# Patient Record
Sex: Male | Born: 1995 | Race: Black or African American | Hispanic: No | Marital: Single | Smoking: Former smoker
Health system: Southern US, Community
[De-identification: ages and names within clinical notes are randomized; demographics above are authoritative.]

## PROBLEM LIST (undated history)

## (undated) DIAGNOSIS — R0789 Other chest pain: Secondary | ICD-10-CM

## (undated) DIAGNOSIS — F1210 Cannabis abuse, uncomplicated: Secondary | ICD-10-CM

## (undated) DIAGNOSIS — R0602 Shortness of breath: Secondary | ICD-10-CM

## (undated) DIAGNOSIS — R9431 Abnormal electrocardiogram [ECG] [EKG]: Secondary | ICD-10-CM

## (undated) DIAGNOSIS — R059 Cough, unspecified: Secondary | ICD-10-CM

## (undated) DIAGNOSIS — R05 Cough: Secondary | ICD-10-CM

## (undated) HISTORY — DX: Shortness of breath: R06.02

## (undated) HISTORY — DX: Other chest pain: R07.89

## (undated) HISTORY — DX: Cough, unspecified: R05.9

## (undated) HISTORY — DX: Cannabis abuse, uncomplicated: F12.10

## (undated) HISTORY — DX: Cough: R05

## (undated) HISTORY — DX: Abnormal electrocardiogram (ECG) (EKG): R94.31

---
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
## 2017-08-29 ENCOUNTER — Other Ambulatory Visit: Payer: Self-pay

## 2017-08-29 ENCOUNTER — Emergency Department (HOSPITAL_COMMUNITY)
Admission: EM | Admit: 2017-08-29 | Discharge: 2017-08-29 | Disposition: A | Discharge status: Home / Self Care | Payer: Self-pay | Attending: Emergency Medicine | Admitting: Emergency Medicine

## 2017-08-29 ENCOUNTER — Encounter (HOSPITAL_COMMUNITY): Payer: Self-pay | Admitting: Emergency Medicine

## 2017-08-29 DIAGNOSIS — B356 Tinea cruris: Secondary | ICD-10-CM | POA: Insufficient documentation

## 2017-08-29 MED ORDER — CLOTRIMAZOLE 1 % EX CREA: TOPICAL_CREAM | CUTANEOUS | 0 refills | Status: DC

## 2017-08-29 NOTE — ED Provider Notes (Signed)
[name] EMERGENCY DEPARTMENT Provider Note   CSN: [id number] Arrival date & time: 08/29/17  0849     History   Chief Complaint Chief Complaint  Patient presents with  . Rash  . Pruritis    HPI Phillip Vasquez is a 22 y.o. male.  HPI  Phillip Vasquez is a 22yo male with no significant past medical history who presents to the emergency department for evaluation of itchy bumps in the groin area.  Patient reports that Phillip Vasquez noticed this rash about 2 days ago.  The rash is bilateral and bumpy in the groin area.  It does not extend to be penis or testicles.  States that Phillip Vasquez has been applying hydrating lotion to the area which helps the itching somewhat.  Phillip Vasquez denies pain over the rash.  Denies any new lotions, soaps, detergents.  Denies fevers, chills, penile discharge, testicular swelling/pain.  Reports that Phillip Vasquez is sexually active with one male partner whom Phillip Vasquez had unprotected sex with about a month ago.  History reviewed. No pertinent past medical history.  There are no active problems to display for this patient.   History reviewed. No pertinent surgical history.      Home Medications    Prior to Admission medications   Not on File    Family History No family history on file.  Social History Social History   Tobacco Use  . Smoking status: Never Smoker  . Smokeless tobacco: Never Used  Substance Use Topics  . Alcohol use: Yes  . Drug use: Not Currently     Allergies   Patient has no allergy information on record.   Review of Systems Review of Systems  Constitutional: Negative for chills and fever.  Gastrointestinal: Negative for abdominal pain, nausea and vomiting.  Genitourinary: Negative for difficulty urinating, discharge, flank pain, frequency, genital sores, penile pain and scrotal swelling.  Musculoskeletal: Negative for gait problem.  Skin: Positive for rash (bilateral bumpy rash in groin area). Negative for color change and wound.    Neurological: Negative for headaches.    Physical Exam Updated Vital Signs BP 138/88 (BP Location: Right Arm)   Pulse 71   Temp 98.9 F (37.2 C) (Oral)   Resp 16   Ht 5\' 7"  (1.702 m)   Wt 122.5 kg (270 lb)   SpO2 100%   BMI 42.29 kg/m   Physical Exam  Constitutional: Phillip Vasquez appears well-developed and well-nourished. No distress.  HENT:  Head: Normocephalic and atraumatic.  Eyes: Right eye exhibits no discharge. Left eye exhibits no discharge.  Pulmonary/Chest: Effort normal. No respiratory distress.  Genitourinary:  Genitourinary Comments: Chaperone present for exam. Bilateral papular rash over the inguinal area with some raised plaques with central clearing. Does not spread to the testicles or penis. No overlying erythema or warmth. No discharge from penis. No signs of lesion or erythema on the penis or testicles. The penis and testicles are nontender. No testicular masses or swelling. No signs of any inguinal hernias.  Neurological: Phillip Vasquez is alert. Coordination normal.  Skin: Skin is warm and dry. Phillip Vasquez is not diaphoretic.  Psychiatric: Phillip Vasquez has a normal mood and affect. His behavior is normal.  Nursing note and vitals reviewed.    ED Treatments / Results  Labs (all labs ordered are listed, but only abnormal results are displayed) Labs Reviewed - No data to display  EKG None  Radiology No results found.  Procedures Procedures (including critical care time)  Medications Ordered in ED Medications - No data  to display   Initial Impression / Assessment and Plan / ED Course  I have reviewed the triage vital signs and the nursing notes.  Pertinent labs & imaging results that were available during my care of the patient were reviewed by me and considered in my medical decision making (see chart for details).    Rash consistent with tinea cruris. No grouped vesicles to suggest HSV. No blisters, no pustules, no warmth, no draining sinus tracts, no superficial abscesses, no  bullous impetigo, no vesicles, no desquamation, no target lesions with dusky purpura or a central bulla. Not tender to touch. No concern for superimposed infection. No concern for SJS, TEN, TSS, tick borne illness, syphilis or other life-threatening condition. Will discharge home with antifungal cream and recommend Benadryl as needed for pruritis. Discussed return precautions and patient agrees and voices understanding to plan. Counseled him that Phillip Vasquez should be regularly tested for STDs if having unprotected intercourse and have given him information to follow-up with the health department.  Patient agrees to plan and has no complaints prior to discharge.  Final Clinical Impressions(s) / ED Diagnoses   Final diagnoses:  Tinea cruris    ED Discharge Orders        Ordered    clotrimazole (LOTRIMIN) 1 % cream     08/29/17 1317       Kellie ShropshireShrosbree, Emily J, PA-C 08/29/17 1323    Arby BarrettePfeiffer, Marcy, MD 09/02/17 [phone]

## 2017-08-29 NOTE — Discharge Instructions (Addendum)
Apply cream to the rash twice a day.   You can take benadryl as needed for itching.   Return to the ER if you have fever, testicular swelling or pain, trouble urinating or have any new or concerning symptoms.

## 2017-08-29 NOTE — ED Triage Notes (Signed)
Pt. Stated, I have these bumps on my genitals and they itch a lot for 2 days . I want to make sure I  don't have herpes. A few weeks ago I had unprotected sex.

## 2018-09-23 ENCOUNTER — Other Ambulatory Visit: Payer: Self-pay

## 2018-09-23 ENCOUNTER — Emergency Department (HOSPITAL_COMMUNITY): Payer: Self-pay

## 2018-09-23 ENCOUNTER — Encounter (HOSPITAL_COMMUNITY): Payer: Self-pay

## 2018-09-23 ENCOUNTER — Emergency Department (HOSPITAL_COMMUNITY)
Admission: EM | Admit: 2018-09-23 | Discharge: 2018-09-23 | Disposition: A | Discharge status: Home / Self Care | Payer: Self-pay | Attending: Emergency Medicine | Admitting: Emergency Medicine

## 2018-09-23 DIAGNOSIS — R0602 Shortness of breath: Secondary | ICD-10-CM | POA: Insufficient documentation

## 2018-09-23 DIAGNOSIS — R9431 Abnormal electrocardiogram [ECG] [EKG]: Secondary | ICD-10-CM | POA: Insufficient documentation

## 2018-09-23 DIAGNOSIS — R059 Cough, unspecified: Secondary | ICD-10-CM

## 2018-09-23 DIAGNOSIS — F1290 Cannabis use, unspecified, uncomplicated: Secondary | ICD-10-CM | POA: Insufficient documentation

## 2018-09-23 DIAGNOSIS — R112 Nausea with vomiting, unspecified: Secondary | ICD-10-CM | POA: Insufficient documentation

## 2018-09-23 DIAGNOSIS — R05 Cough: Secondary | ICD-10-CM | POA: Insufficient documentation

## 2018-09-23 LAB — CBC WITH DIFFERENTIAL/PLATELET
Abs Immature Granulocytes: 0.02 10*3/uL (ref 0.00–0.07)
Basophils Absolute: 0 10*3/uL (ref 0.0–0.1)
Basophils Relative: 0 %
Eosinophils Absolute: 0.3 10*3/uL (ref 0.0–0.5)
Eosinophils Relative: 3 %
HCT: 50.7 % (ref 39.0–52.0)
Hemoglobin: 15.1 g/dL (ref 13.0–17.0)
Immature Granulocytes: 0 %
Lymphocytes Relative: 18 %
Lymphs Abs: 1.7 10*3/uL (ref 0.7–4.0)
MCH: 23.7 pg — ABNORMAL LOW (ref 26.0–34.0)
MCHC: 29.8 g/dL — ABNORMAL LOW (ref 30.0–36.0)
MCV: 79.7 fL — ABNORMAL LOW (ref 80.0–100.0)
Monocytes Absolute: 0.5 10*3/uL (ref 0.1–1.0)
Monocytes Relative: 6 %
Neutro Abs: 7 10*3/uL (ref 1.7–7.7)
Neutrophils Relative %: 73 %
Platelets: 341 10*3/uL (ref 150–400)
RBC: 6.36 MIL/uL — ABNORMAL HIGH (ref 4.22–5.81)
RDW: 14.4 % (ref 11.5–15.5)
WBC: 9.6 10*3/uL (ref 4.0–10.5)
nRBC: 0 % (ref 0.0–0.2)

## 2018-09-23 LAB — BASIC METABOLIC PANEL
Anion gap: 10 (ref 5–15)
BUN: <5 mg/dL — ABNORMAL LOW (ref 6–20)
CO2: 26 mmol/L (ref 22–32)
Calcium: 9.1 mg/dL (ref 8.9–10.3)
Chloride: 103 mmol/L (ref 98–111)
Creatinine, Ser: 0.95 mg/dL (ref 0.61–1.24)
GFR calc Af Amer: >60 mL/min (ref >60)
GFR calc non Af Amer: >60 mL/min (ref >60)
Glucose, Bld: 95 mg/dL (ref 70–99)
Potassium: 3.9 mmol/L (ref 3.5–5.1)
Sodium: 139 mmol/L (ref 135–145)

## 2018-09-23 LAB — TROPONIN I: Troponin I: <0.03 ng/mL (ref <0.03)

## 2018-09-23 MED ORDER — ALBUTEROL SULFATE HFA 108 (90 BASE) MCG/ACT IN AERS
2.0000 | INHALATION_SPRAY | Freq: Once | RESPIRATORY_TRACT | Status: AC
  Administered 2018-09-23: 18:00:00 2 via RESPIRATORY_TRACT
  Filled 2018-09-23: qty 6.7

## 2018-09-23 MED ORDER — BENZONATATE 100 MG PO CAPS: 100.0000 mg | ORAL_CAPSULE | Freq: Three times a day (TID) | ORAL | 0 refills | Status: AC | PRN

## 2018-09-23 NOTE — ED Provider Notes (Signed)
[name] EMERGENCY DEPARTMENT Provider Note   CSN: [id number] Arrival date & time: 09/23/18  1408    History   Chief Complaint Chief Complaint  Patient presents with  . Cough    HPI Braden L Hamblin is a 23 y.o. male with no significant past medical history presents today for evaluation of acute onset, persistent chest tightness beginning today.  He denies any chest pain but he feels somewhat short of breath constantly, no aggravating or alleviating factors noted.  He notes a nonproductive cough that may have been present for the last few days and he reports is mild.  He denies any fevers, chills, sore throat, nasal congestion, or abdominal pain.  He did have one episode of nonbloody nonbilious emesis yesterday but thinks it may have been posttussive in nature.  He smokes marijuana daily but denies any other drug use, alcohol intake, or cigarette smoking.  He has had no sick contacts but does work in a [employer] and at a [employer] as a Conservation officer, nature.  No family history of heart disease at a young age.  No recent travel or surgeries.     The history is provided by the patient.    History reviewed. No pertinent past medical history.  There are no active problems to display for this patient.   History reviewed. No pertinent surgical history.      Home Medications    Prior to Admission medications   Medication Sig Start Date End Date Taking? Authorizing Provider  benzonatate (TESSALON) 100 MG capsule Take 1 capsule (100 mg total) by mouth 3 (three) times daily as needed for cough. 09/23/18   Luevenia Maxin, Antavia Tandy A, PA-C  clotrimazole (LOTRIMIN) 1 % cream Apply to affected area 2 times daily 08/29/17   Kellie Shropshire, PA-C    Family History History reviewed. No pertinent family history.  Social History Social History   Tobacco Use  . Smoking status: Never Smoker  . Smokeless tobacco: Never Used  Substance Use Topics  . Alcohol use: Yes  . Drug use: Not  Currently     Allergies   Patient has no known allergies.   Review of Systems Review of Systems  Constitutional: Negative for chills and fever.  Respiratory: Positive for cough and shortness of breath.   Cardiovascular: Negative for chest pain.  Gastrointestinal: Positive for nausea (resolved) and vomiting (resolved). Negative for abdominal pain.  All other systems reviewed and are negative.    Physical Exam Updated Vital Signs BP 125/82   Pulse 73   Temp 98.1 F (36.7 C) (Oral)   Resp 19   SpO2 98%   Physical Exam Vitals signs and nursing note reviewed.  Constitutional:      General: He is not in acute distress.    Appearance: He is well-developed.  HENT:     Head: Normocephalic and atraumatic.  Eyes:     General:        Right eye: No discharge.        Left eye: No discharge.     Conjunctiva/sclera: Conjunctivae normal.  Neck:     Vascular: No JVD.     Trachea: No tracheal deviation.  Cardiovascular:     Rate and Rhythm: Normal rate and regular rhythm.     Pulses: Normal pulses.     Heart sounds: Normal heart sounds.  Pulmonary:     Effort: Pulmonary effort is normal.     Comments: Somewhat diminished breath sounds globally, speaking in full sentences without  difficulty Abdominal:     General: Abdomen is flat.     Palpations: Abdomen is soft.     Tenderness: There is no abdominal tenderness. There is no guarding or rebound.  Skin:    General: Skin is warm and dry.     Findings: No erythema.  Neurological:     Mental Status: He is alert.  Psychiatric:        Behavior: Behavior normal.      ED Treatments / Results  Labs (all labs ordered are listed, but only abnormal results are displayed) Labs Reviewed  BASIC METABOLIC PANEL - Abnormal; Notable for the following components:      Result Value   BUN <5 (*)    All other components within normal limits  CBC WITH DIFFERENTIAL/PLATELET - Abnormal; Notable for the following components:   RBC 6.36 (*)     MCV 79.7 (*)    MCH 23.7 (*)    MCHC 29.8 (*)    All other components within normal limits  TROPONIN I    EKG EKG Interpretation  Date/Time:  Friday September 23 2018 17:17:47 EDT Ventricular Rate:  68 PR Interval:    QRS Duration: 98 QT Interval:  378 QTC Calculation: 402 R Axis:   44 Text Interpretation:  Sinus rhythm Lateral infarct, acute Borderline ST elevation, anterior leads Marked T-wave abnormality, consider inferolateral ischemia No old tracing to compare Confirmed by Eber HongMiller, Brian ([id number]) on 09/23/2018 5:24:46 PM   Radiology Dg Chest Portable 1 View  Result Date: 09/23/2018 CLINICAL DATA:  Shortness of breath EXAM: PORTABLE CHEST 1 VIEW COMPARISON:  None. FINDINGS: The heart size and mediastinal contours are within normal limits. Both lungs are clear. The visualized skeletal structures are unremarkable. IMPRESSION: No active disease. Electronically Signed   By: Elige KoHetal  Patel   On: 09/23/2018 17:55    Procedures Procedures (including critical care time)  Medications Ordered in ED Medications  albuterol (VENTOLIN HFA) 108 (90 Base) MCG/ACT inhaler 2 puff (2 puffs Inhalation Given 09/23/18 1742)     Initial Impression / Assessment and Plan / ED Course  I have reviewed the triage vital signs and the nursing notes.  Pertinent labs & imaging results that were available during my care of the patient were reviewed by me and considered in my medical decision making (see chart for details).        Patient presenting for evaluation of acute onset chest tightness earlier today.  He is afebrile, initially mildly tachycardic and mildly tachypneic on triage but normal vital signs on my evaluation and subsequent reevaluation's.  He is nontoxic in appearance.  He did have some nausea and vomiting yesterday but none today and abdominal examination is benign.  Doubt acute surgical abdominal pathology.  Lung sounds are generally clear but somewhat diminished bilaterally, no wheezing or  other adventitious breath sounds noted.  He is low risk for heart disease and I doubt PE.  EKG showed some subtle potential ST segment elevations in leads I and aVL with what appeared to be depressions in leads III and aVF.  Will consult cardiology for further recommendations. 5:37 PM Spoke with Dr. Jacques NavyAcharya with cardiology who has reviewed the patient's EKG and we have discussed his presentation.  She feels that his EKG likely represents early repolarization changes vs LVH; she recommends repeat EKG in half an hour. Single trop reasonable.  Also recommends cardiology visit (televisit okay), and outpatient echo within the next 7-10 days.   Chest x-ray shows no acute cardiopulmonary  abnormalities.  Troponin negative, remainder of lab work reviewed by me shows no leukocytosis, no anemia, no metabolic arrangements, no renal insufficiency.  He is resting comfortably in no apparent distress on reevaluation.  He was ambulated in the ED with stable SPO2 saturations.  No evidence of cardiac tamponade, pericarditis, myocarditis, dissection, pneumonia, or pneumothorax.  Low suspicion of coronavirus however I did recommend that given he is experiencing a dry cough that he quarantine himself until he is asymptomatic.  We discussed hygiene and strict ED return precautions.  Will discharge with Tessalon for cough and albuterol inhaler as needed for shortness of breath.  Pt verbalized understanding of and agreement with plan and is safe for discharge home at this time.  Patient was seen and evaluated by Dr. Hyacinth Meeker who agrees with assessment and plan at this time.  JANICE HUGHBANKS was evaluated in Emergency Department on 09/23/2018 for the symptoms described in the history of present illness. He was evaluated in the context of the global COVID-19 pandemic, which necessitated consideration that the patient might be at risk for infection with the SARS-CoV-2 virus that causes COVID-19. Institutional protocols and algorithms that  pertain to the evaluation of patients at risk for COVID-19 are in a state of rapid change based on information released by regulatory bodies including the CDC and federal and state organizations. These policies and algorithms were followed during the patient's care in the ED.   Final Clinical Impressions(s) / ED Diagnoses   Final diagnoses:  SOB (shortness of breath)  Cough  Abnormal EKG    ED Discharge Orders         Ordered    benzonatate (TESSALON) 100 MG capsule  3 times daily PRN     09/23/18 1902           Jeanie Sewer, PA-C 09/23/18 2015    Eber Hong, MD 09/26/18 [phone]

## 2018-09-23 NOTE — ED Notes (Signed)
Ambulated pt with pulse ox. Pt had steady gait. Pt denies feeling dizzy or weak. However pt did state he felt short of breath, like he couldn't get a good deep breath in and slight chest tightness similar to what he was feeling before. Pt HR went up to 113 and ox stayed before 97-100%. Pt brought back to bed, hooked up to monitor and RN Ryland notified.

## 2018-09-23 NOTE — ED Triage Notes (Signed)
Pt reports cough for several days and new chest tightness onset today. Pt denies hx of asthma. Denies sick contacts.

## 2018-09-23 NOTE — ED Notes (Signed)
Patient verbalizes understanding of discharge instructions. Opportunity for questioning and answers were provided. Armband removed by staff, pt discharged from ED.  

## 2018-09-23 NOTE — ED Provider Notes (Signed)
The patient is a 23 year old male, he has no significant chronic medical problems other than a history of marijuana abuse.  States that he has had coughing for several days, he woke up this morning with tightness in his chest and a persistent cough.  He felt like he was constipated after having an episode of vomiting yesterday so he took some laxatives and then had some loose stools.  At this time he still feels the tightness.  His exam is unremarkable with clear heart and lung sounds, no murmurs, no edema, no JVD, no rales or wheezing.  EKG is abnormal with ST elevations in leads I and aVL with reciprocal ST depression in lead III and a slight depression in lead aVF.  Limb leads are abnormal but precordial leads look pretty normal.  We will pursue with cardiology consultation at least for evaluation of the EKG, the patient is low risk for ischemia however with tightness in the chest and an abnormal EKG we will perform a troponin.  Patient is agreeable  Medical screening examination/treatment/procedure(s) were conducted as a shared visit with non-physician practitioner(s) and myself.  I personally evaluated the patient during the encounter.  Clinical Impression:    Final diagnoses:  SOB (shortness of breath)  Cough  Abnormal EKG     EKG Interpretation  Date/Time:  Friday September 23 2018 18:31:52 EDT Ventricular Rate:  78 PR Interval:    QRS Duration: 85 QT Interval:  359 QTC Calculation: 409 R Axis:   46 Text Interpretation:  Sinus rhythm St elevation anterior leads Inferior T wave abnormality suggesting ischemia No significant change since last tracing Confirmed by Rochele Raring [phone]) on 09/24/2018 10:39:27 AM         Eber Hong, MD 09/26/18 1223

## 2018-09-23 NOTE — Discharge Instructions (Addendum)
Use the albuterol inhaler 1 to 2 puffs every 4-6 hours as needed for shortness of breath.  You can take Tessalon as needed for cough.  You can also alternate ibuprofen or Tylenol as needed for fevers or aches or pains.  Drink plenty of fluids and get plenty of rest.  I would recommend not going to work for the next 4 days while you have symptoms.  If you develop a fever, I would recommend not going back to work until you have been fever free for 3 days without the use of ibuprofen or Tylenol.  Your EKG was abnormal in the emergency department today.  However there was no evidence of a heart attack.  Please call the cardiologist's office on Monday to set up a follow-up appointment and an outpatient echocardiogram (ultrasound of the heart) to be completed in the next 7 to 10 days.  Return to the emergency department immediately if any concerning signs or symptoms develop such as worsening shortness of breath, high fevers, persistent vomiting, passing out, or chest pains.

## 2018-09-26 ENCOUNTER — Telehealth: Payer: Self-pay | Admitting: Cardiovascular Disease

## 2018-09-26 NOTE — Telephone Encounter (Signed)
   Cardiac Questionnaire:    Since your last visit or hospitalization:    1. Have you been having new or worsening chest pain? NO   2. Have you been having new or worsening shortness of breath? SOB with activity 3. Have you been having new or worsening leg swelling, wt gain, or increase in abdominal girth (pants fitting more tightly)? NO   4. Have you had any passing out spells? NO    *A YES to any of these questions would result in the appointment being kept. *If all the answers to these questions are NO, we should indicate that given the current situation regarding the worldwide coronarvirus pandemic, at the recommendation of the CDC, we are looking to limit gatherings in our waiting area, and thus will reschedule their appointment beyond four weeks from today.   _____________   COVID-19 Pre-Screening Questions:  . Do you currently have a fever? NO . Have you recently travelled on a cruise, internationally, or to [place], [place], [place], [place], [place], or [place], [place] [employer]) ? NO . Have you been in contact with someone that is currently pending confirmation of Covid19 testing or has been confirmed to have the Covid19 virus? NO Are you currently experiencing fatigue or cough? Yes over a week ago     Dr. Eden Emms has agreed to see new patient on his DOD day. Did Screening for patient, and moved patient to Friday.

## 2018-09-26 NOTE — Progress Notes (Deleted)
Virtual Visit via Video Note   This visit type was conducted due to national recommendations for restrictions regarding the COVID-19 Pandemic (e.g. social distancing) in an effort to limit this patient's exposure and mitigate transmission in our community.  Due to his co-morbid illnesses, this patient is at least at moderate risk for complications without adequate follow up.  This format is felt to be most appropriate for this patient at this time.  All issues noted in this document were discussed and addressed.  A limited physical exam was performed with this format.  Please refer to the patient's chart for his consent to telehealth for [facility].   Evaluation Performed:  Follow-up visit  Date:  09/26/2018   ID:  Phillip Vasquez, DOB 10/02/1995, MRN [id number]  Patient Location: Home Provider Location: Office  PCP:  Patient, No Pcp Per  Cardiologist:  Johnsie Cancel Electrophysiologist:  None   Chief Complaint:  Cough and Chest Pain   History of Present Illness:    Phillip Vasquez is a 23 y.o. male referred by [facility] ER Dr Noemi Chapel for chest pain. Reviewed ER note from 09/23/18 History of marijuana use. Cough for several days Constipation and vomiting Took laxative with loose stools Pain is atypical *** In ER telemetry normal Noted HR 113 with ambulation with sense of pain in chest and dyspnea Sats normal CXR NAD normal Labs normal troponin negative   ECG with ? Voltage LVH T wave inversions 3,F precordial J point elevation  Given Tessalon capsule for cough   ***   The patient does not have symptoms concerning for COVID-19 infection (fever, chills, cough, or new shortness of breath).   PMH:  No significant No previous surgeries    No outpatient medications have been marked as taking for the 09/28/18 encounter (Appointment) with Josue Hector, MD.     Allergies:   Patient has no known allergies.   Social History   Tobacco Use  . Smoking status: Never Smoker  . Smokeless  tobacco: Never Used  Substance Use Topics  . Alcohol use: Yes  . Drug use: Not Currently     Family Hx: The patient's family history is not on file.  ROS:   Please see the history of present illness.     All other systems reviewed and are negative.   Prior CV studies:   The following studies were reviewed today:  ER notes 4/17 labs ECG and CXR   Labs/Other Tests and Data Reviewed:    EKG:   See HPI no old tracings   Recent Labs: 09/23/2018: BUN <5; Creatinine, Ser 0.95; Hemoglobin 15.1; Platelets 341; Potassium 3.9; Sodium 139   Recent Lipid Panel No results found for: CHOL, TRIG, HDL, CHOLHDL, LDLCALC, LDLDIRECT  Wt Readings from Last 3 Encounters:  08/29/17 122.5 kg     Objective:    Vital Signs:  There were no vitals taken for this visit.   No distress Skin warm and dry No tachypnea No JVP elevation  No edema  ASSESSMENT & PLAN:    Chest Pain : atypical Abnormal ECG may have pericarditis. Lab work neede to include ESR, C reactive Protein ANA and RF Empiric Rx with ASA , ibuprofen 400 tid and colchicine 0.6 mg bid for 6 weeks or until symptoms gone. Echo to r/o effusion , structural heart disease and HOCM  Cough:  CXR normal non toxic normal WBC observe  Abnormal ECG:  See above at some point if echo normal may need cardiac  CTA to r/o coronary anomaly   COVID-19 Education: The signs and symptoms of COVID-19 were discussed with the patient and how to seek care for testing (follow up with PCP or arrange E-visit).  The importance of social distancing was discussed today.  Time:   Today, I have spent 30 minutes with the patient with telehealth technology discussing the above problems.     Medication Adjustments/Labs and Tests Ordered: Current medicines are reviewed at length with the patient today.  Concerns regarding medicines are outlined above.   Tests Ordered:  Lab work: ESR, ANA, RF C reactive protein Echo:  Abnormal ECG r/o HOCM and Effusion ?  Pericarditis   Medication Changes:  ASE 325 mg Ibuprofen 400 tid Colchicine 0.76m bid   Disposition:  Follow up in 8 weeks if echo normal   Signed, PJenkins Rouge MD  09/26/2018 3:39 PM    [place]

## 2018-09-26 NOTE — Telephone Encounter (Signed)
New Message           Need records from [place], patient is seen [place] on Wednesday.

## 2018-09-28 ENCOUNTER — Ambulatory Visit: Payer: Medicaid Other | Admitting: Cardiovascular Disease

## 2018-09-29 NOTE — Progress Notes (Signed)
Virtual Visit via Video Note   This visit type was conducted due to national recommendations for restrictions regarding the COVID-19 Pandemic (e.g. social distancing) in an effort to limit this patient's exposure and mitigate transmission in our community.  Due to his co-morbid illnesses, this patient is at least at moderate risk for complications without adequate follow up.  This format is felt to be most appropriate for this patient at this time.  All issues noted in this document were discussed and addressed.  A limited physical exam was performed with this format.  Please refer to the patient's chart for his consent to telehealth for [facility].   Evaluation Performed:  Follow-up visit  Date:  09/30/2018   ID:  Phillip Vasquez, DOB 1996/04/05, MRN [id number]  Patient Location: Home Provider Location: Office  PCP:  Patient, No Pcp Per  Cardiologist:  Johnsie Cancel Electrophysiologist:  None   Chief Complaint:  Cough and Chest Pain   History of Present Illness:    Phillip Vasquez is a 23 y.o. male referred by [facility] ER Dr Noemi Chapel for chest pain. Reviewed ER note from 09/23/18 History of marijuana use. Cough for several days Constipation and vomiting Took laxative with loose stools Pain is atypical    In ER telemetry normal Noted HR 113 with ambulation with sense of pain in chest and dyspnea Sats normal CXR NAD normal Labs normal troponin negative   ECG with ? 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 No pleurisy   The patient does not have symptoms concerning for COVID-19 infection (fever, chills, cough, or new shortness of breath).   PMH:  No significant No previous surgeries    Current Meds  Medication Sig  . benzonatate (TESSALON) 100 MG capsule Take  1 capsule (100 mg total) by mouth 3 (three) times daily as needed for cough.     Allergies:   Patient has no known allergies.   Social History   Tobacco Use  . Smoking status: Former Smoker    Types: Cigars  . Smokeless tobacco: Never Used  . Tobacco comment: OCC  Substance Use Topics  . Alcohol use: Yes    Comment: OCC  . Drug use: Not Currently    Types: Marijuana    Comment: OCC     Family Hx: The patient's family history includes Asthma in his brother; Healthy in his brother, father, mother, and sister.  ROS:   Please see the history of present illness.     All other systems reviewed and are negative.   Prior CV studies:   The following studies were reviewed today:  ER notes 4/17 labs ECG and CXR   Labs/Other Tests and Data Reviewed:    EKG:   See HPI no old tracings   Recent Labs: 09/23/2018: BUN <5; Creatinine, Ser 0.95; Hemoglobin 15.1; Platelets 341; Potassium 3.9; Sodium 139   Recent Lipid Panel No results found for: CHOL, TRIG, HDL, CHOLHDL, LDLCALC, LDLDIRECT  Wt Readings from Last 3 Encounters:  09/30/18 119.5 kg  08/29/17 122.5 kg     Objective:    Vital Signs:  BP 132/82   Pulse 71   Ht _0  (1.702 m)   Wt 119.5 kg   SpO2 98%   BMI 41.25  kg/m    No distress Skin warm and dry No tachypnea No JVP elevation  No edema  ASSESSMENT & PLAN:    Chest Pain : atypical Abnormal ECG may have pericarditis. Lab work neede to include ESR, C reactive Protein ANA and RF Empiric Rx with ASA , ibuprofen 400 tid and colchicine 0.6 mg bid for 6 weeks or until symptoms gone. Echo to r/o effusion , structural heart disease and HOCM  Cough:  CXR normal non toxic normal WBC observe  Abnormal ECG:  See above at some point if echo normal may need cardiac CTA to r/o coronary anomaly   COVID-19 Education: The signs and symptoms of COVID-19 were discussed with the patient and how to seek care for testing (follow up with PCP or arrange E-visit).  The  importance of social distancing was discussed today.  Time:   Today, I have spent 30 minutes with the patient with telehealth technology discussing the above problems.     Medication Adjustments/Labs and Tests Ordered: Current medicines are reviewed at length with the patient today.  Concerns regarding medicines are outlined above.   Tests Ordered:  Lab work: ESR, ANA, RF C reactive protein Echo:  Abnormal ECG r/o HOCM and Effusion ? Pericarditis   Medication Changes:  ASE 325 mg Ibuprofen 400 tid Colchicine 0.74m bid   Disposition:  Follow up in 8 weeks if echo normal   Signed, PJenkins Rouge MD  09/30/2018 10:09 AM    [place]

## 2018-09-30 ENCOUNTER — Ambulatory Visit (INDEPENDENT_AMBULATORY_CARE_PROVIDER_SITE_OTHER): Payer: Self-pay | Admitting: Cardiovascular Disease

## 2018-09-30 ENCOUNTER — Encounter: Payer: Self-pay | Admitting: Cardiovascular Disease

## 2018-09-30 ENCOUNTER — Other Ambulatory Visit: Payer: Self-pay

## 2018-09-30 VITALS — BP 132/82 | HR 71 | Ht 67.0 in | Wt 263.4 lb

## 2018-09-30 DIAGNOSIS — R079 Chest pain, unspecified: Secondary | ICD-10-CM

## 2018-09-30 DIAGNOSIS — R0789 Other chest pain: Secondary | ICD-10-CM

## 2018-09-30 NOTE — Patient Instructions (Signed)
Medication Instructions:   If you need a refill on your cardiac medications before your next appointment, please call your pharmacy.   Lab work: Your physician recommends that you have lab work today- ANA, ESR, RF, and Creative Protein.  If you have labs (blood work) drawn today and your tests are completely normal, you will receive your results only by: Marland Kitchen MyChart Message (if you have MyChart) OR . A paper copy in the mail If you have any lab test that is abnormal or we need to change your treatment, we will call you to review the results.  Testing/Procedures: Your physician has requested that you have an echocardiogram as soon as possible. Echocardiography is a painless test that uses sound waves to create images of your heart. It provides your doctor with information about the size and shape of your heart and how well your heart's chambers and valves are working. This procedure takes approximately one hour. There are no restrictions for this procedure.  Follow-Up: At [facility], you and your health needs are our priority.  As part of our continuing mission to provide you with exceptional heart care, we have created designated Provider Care Teams.  These Care Teams include your primary Cardiologist (physician) and Advanced Practice Providers (APPs -  Physician Assistants and Nurse Practitioners) who all work together to provide you with the care you need, when you need it. You will need a follow up appointment after echocardiogram/ virtual visit.   You may see Dr. Johnsie Cancel or one of the following Advanced Practice Providers on your designated Care Team:   Truitt Merle, NP Cecilie Kicks, NP . Kathyrn Drown, NP

## 2018-10-01 LAB — ANA: Anti Nuclear Antibody (ANA): NEGATIVE

## 2018-10-01 LAB — SEDIMENTATION RATE: Sed Rate: 29 mm/hr — ABNORMAL HIGH (ref 0–15)

## 2018-10-01 LAB — C-REACTIVE PROTEIN: CRP: 5 mg/L (ref 0–10)

## 2018-10-01 LAB — RHEUMATOID FACTOR: Rhuematoid fact SerPl-aCnc: <10 IU/mL (ref 0.0–13.9)

## 2018-10-03 ENCOUNTER — Other Ambulatory Visit: Payer: Self-pay

## 2018-10-03 ENCOUNTER — Ambulatory Visit (HOSPITAL_COMMUNITY): Payer: Self-pay | Attending: Cardiology

## 2018-10-03 DIAGNOSIS — R079 Chest pain, unspecified: Secondary | ICD-10-CM

## 2018-10-03 DIAGNOSIS — R0789 Other chest pain: Secondary | ICD-10-CM | POA: Insufficient documentation

## 2018-10-04 ENCOUNTER — Other Ambulatory Visit (HOSPITAL_COMMUNITY): Payer: Self-pay

## 2018-10-11 NOTE — Progress Notes (Signed)
Virtual Visit via Video Note   This visit type was conducted due to national recommendations for restrictions regarding the COVID-19 Pandemic (e.g. social distancing) in an effort to limit this patient's exposure and mitigate transmission in our community.  Due to his co-morbid illnesses, this patient is at least at moderate risk for complications without adequate follow up.  This format is felt to be most appropriate for this patient at this time.  All issues noted in this document were discussed and addressed.  A limited physical exam was performed with this format.  Please refer to the patient's chart for his consent to telehealth for [facility].   Evaluation Performed:  Follow-up visit  Date:  10/14/2018   ID:  Phillip Vasquez, DOB 1996-06-08, MRN [id number]  Patient Location: Home Provider Location: Office  PCP:  Patient, No Pcp Per  Cardiologist:  Johnsie Cancel Electrophysiologist:  None   Chief Complaint:  Cough and Chest Pain   History of Present Illness:    Phillip Vasquez is a 23 y.o. male referred by [facility] ER Dr Noemi Chapel for chest pain. Reviewed ER note from 09/23/18 History of marijuana use. Cough for several days Constipation and vomiting Took laxative with loose stools Pain is atypical    In ER telemetry normal Noted HR 113 with ambulation with sense of pain in chest and dyspnea Sats normal CXR NAD normal Labs normal troponin negative   ECG with ? 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 No pleurisy   ESR 29  CRP normal RF and ANA normal  Echo 10/03/18 normal EF 55-60% no effusion      The patient does not have symptoms concerning for COVID-19 infection (fever, chills, cough, or new shortness of breath).   PMH:  No significant No previous  surgeries    No outpatient medications have been marked as taking for the 10/14/18 encounter (Appointment) with Josue Hector, MD.     Allergies:   Patient has no known allergies.   Social History   Tobacco Use  . Smoking status: Former Smoker    Types: Cigars  . Smokeless tobacco: Never Used  . Tobacco comment: OCC  Substance Use Topics  . Alcohol use: Yes    Comment: OCC  . Drug use: Not Currently    Types: Marijuana    Comment: OCC     Family Hx: The patient's family history includes Asthma in his brother; Healthy in his brother, father, mother, and sister.  ROS:   Please see the history of present illness.     All other systems reviewed and are negative.   Prior CV studies:   The following studies were reviewed today:  ER notes 4/17 labs ECG and CXR  ECHO 10/03/18   Labs/Other Tests and Data Reviewed:    EKG:   SR early repolarization rate 71 normal   Recent Labs: 09/23/2018: BUN <5; Creatinine, Ser 0.95; Hemoglobin 15.1; Platelets 341; Potassium 3.9; Sodium 139   Recent Lipid Panel No results found for: CHOL, TRIG, HDL, CHOLHDL, LDLCALC, LDLDIRECT  Wt Readings from Last 3 Encounters:  09/30/18 119.5 kg  08/29/17 122.5 kg     Objective:    Vital Signs:  There were no vitals taken for this visit.  No distress Skin warm and dry No tachypnea No JVP elevation  No edema  ASSESSMENT & PLAN:    Chest Pain : atypical ECG with early repolarization Echo normal no effusion and normal EF Lab work not consistent with pericarditis   Cough:  CXR normal non toxic normal WBC observe  Abnormal ECG:  Favor early repolarization rather than pericarditis   COVID-19 Education: The signs and symptoms of COVID-19 were discussed with the patient and how to seek care for testing (follow up with PCP or arrange E-visit).  The importance of social distancing was discussed today.  Time:   Today, I have spent 30 minutes with the patient with telehealth technology  discussing the above problems.     Medication Adjustments/Labs and Tests Ordered: Current medicines are reviewed at length with the patient today.  Concerns regarding medicines are outlined above.   Tests Ordered:     Medication Changes:    ASE 325 mg Ibuprofen 400 tid Colchicine 0.66m bid   Disposition:  Follow up     Signed, PJenkins Rouge MD  10/14/2018 8:06 AM    [place]

## 2018-10-14 ENCOUNTER — Telehealth (INDEPENDENT_AMBULATORY_CARE_PROVIDER_SITE_OTHER): Payer: Self-pay | Admitting: Cardiovascular Disease

## 2018-10-14 ENCOUNTER — Telehealth: Payer: Self-pay

## 2018-10-14 ENCOUNTER — Other Ambulatory Visit: Payer: Self-pay

## 2018-10-14 DIAGNOSIS — R079 Chest pain, unspecified: Secondary | ICD-10-CM

## 2018-10-14 NOTE — Telephone Encounter (Signed)
I have attempted to contact this patient by phone about appt with Dr. Eden Emms today 10/14/18 at 8:15 am but gave an error message saying welcome to [employer]. Tried to call mother's number (DPR on file) to see if patient could be reached but no answer.

## 2020-06-26 ENCOUNTER — Encounter (HOSPITAL_COMMUNITY): Payer: Self-pay

## 2020-06-26 ENCOUNTER — Emergency Department (HOSPITAL_COMMUNITY): Payer: Medicaid Other

## 2020-06-26 ENCOUNTER — Emergency Department (HOSPITAL_COMMUNITY)
Admission: EM | Admit: 2020-06-26 | Discharge: 2020-06-27 | Disposition: A | Discharge status: Home / Self Care | Payer: Medicaid Other | Attending: Emergency Medicine | Admitting: Emergency Medicine

## 2020-06-26 ENCOUNTER — Other Ambulatory Visit: Payer: Self-pay

## 2020-06-26 DIAGNOSIS — W228XXA Striking against or struck by other objects, initial encounter: Secondary | ICD-10-CM | POA: Insufficient documentation

## 2020-06-26 DIAGNOSIS — Z5321 Procedure and treatment not carried out due to patient leaving prior to being seen by health care provider: Secondary | ICD-10-CM | POA: Insufficient documentation

## 2020-06-26 DIAGNOSIS — S90931A Unspecified superficial injury of right great toe, initial encounter: Secondary | ICD-10-CM | POA: Insufficient documentation

## 2020-06-26 NOTE — ED Triage Notes (Signed)
Patient reports he stubbed his R great toe about 1 week ago and is having trouble walking on it.

## 2020-06-27 NOTE — ED Notes (Signed)
No answer for ready room 

## 2020-06-30 ENCOUNTER — Other Ambulatory Visit: Payer: Self-pay

## 2020-06-30 ENCOUNTER — Emergency Department (HOSPITAL_COMMUNITY)
Admission: EM | Admit: 2020-06-30 | Discharge: 2020-06-30 | Disposition: A | Discharge status: Home / Self Care | Payer: Self-pay | Attending: Emergency Medicine | Admitting: Emergency Medicine

## 2020-06-30 ENCOUNTER — Encounter (HOSPITAL_COMMUNITY): Payer: Self-pay | Admitting: Pediatrics

## 2020-06-30 ENCOUNTER — Emergency Department (HOSPITAL_COMMUNITY): Payer: Self-pay

## 2020-06-30 DIAGNOSIS — Y9301 Activity, walking, marching and hiking: Secondary | ICD-10-CM | POA: Insufficient documentation

## 2020-06-30 DIAGNOSIS — M79674 Pain in right toe(s): Secondary | ICD-10-CM | POA: Insufficient documentation

## 2020-06-30 DIAGNOSIS — Z87891 Personal history of nicotine dependence: Secondary | ICD-10-CM | POA: Insufficient documentation

## 2020-06-30 DIAGNOSIS — X501XXA Overexertion from prolonged static or awkward postures, initial encounter: Secondary | ICD-10-CM | POA: Insufficient documentation

## 2020-06-30 MED ORDER — IBUPROFEN 800 MG PO TABS
800.0000 mg | ORAL_TABLET | Freq: Once | ORAL | Status: AC
  Administered 2020-06-30: 800 mg via ORAL
  Filled 2020-06-30: qty 1

## 2020-06-30 MED ORDER — ACETAMINOPHEN 500 MG PO TABS
1000.0000 mg | ORAL_TABLET | Freq: Once | ORAL | Status: AC
  Administered 2020-06-30: 1000 mg via ORAL
  Filled 2020-06-30: qty 2

## 2020-06-30 NOTE — ED Triage Notes (Signed)
C/o toe pain on right side x 1 week, swelling decreasing.  Stated he was here 3 days ago, but LWBS d/t wait times.

## 2020-06-30 NOTE — ED Notes (Signed)
Patient transported to X-ray 

## 2020-06-30 NOTE — ED Provider Notes (Signed)
[facility] EMERGENCY DEPARTMENT Provider Note   CSN: [id number] Arrival date & time: 06/30/20  4098     History Chief Complaint  Patient presents with  . Toe Pain    Phillip Vasquez is a 25 y.o. male.  25 yo M with a chief complaints of right great toe pain.  The patient was walking up the stairs and lost his balance and he thinks his great toe was pointed up on the rest of his toes were pointed down.  Since then has had pain to the right great toe especially with bearing weight and ambulating.  He denies any other trauma to the area.  Denies fevers or chills.  No history of gout.  No history of plantar fasciitis.  No ankle or knee pain.  The history is provided by the patient.  Injury This is a new problem. The current episode started more than 2 days ago. The problem occurs constantly. The problem has not changed since onset.The symptoms are aggravated by bending, twisting and walking. Nothing relieves the symptoms. The treatment provided no relief.       Past Medical History:  Diagnosis Date  . Abnormal EKG   . Chest tightness   . Cough   . Marijuana abuse   . SOB (shortness of breath)     There are no problems to display for this patient.   Past Surgical History:  Procedure Laterality Date  . SHOULDER SURGERY Left 2014       Family History  Problem Relation Age of Onset  . Healthy Mother   . Healthy Father   . Healthy Sister   . Asthma Brother   . Healthy Brother     Social History   Tobacco Use  . Smoking status: Former Smoker    Types: Cigars  . Smokeless tobacco: Never Used  . Tobacco comment: OCC  Substance Use Topics  . Alcohol use: Yes    Comment: OCC  . Drug use: Not Currently    Types: Marijuana    Comment: OCC    Home Medications Prior to Admission medications   Medication Sig Start Date End Date Taking? Authorizing Provider  benzonatate (TESSALON) 100 MG capsule Take 1 capsule (100 mg total) by mouth 3 (three)  times daily as needed for cough. 09/23/18   Michela Pitcher A, PA-C    Allergies    Patient has no known allergies.  Review of Systems   Review of Systems  Physical Exam Updated Vital Signs BP (!) 140/94 (BP Location: Left Arm)   Pulse 75   Temp 98 F (36.7 C) (Oral)   Resp 16   Ht 5\' 8"  (1.727 m)   Wt 117.9 kg   SpO2 100%   BMI 39.53 kg/m   Physical Exam Vitals and nursing note reviewed.  Constitutional:      Appearance: He is well-developed and well-nourished.  HENT:     Head: Normocephalic and atraumatic.  Eyes:     Extraocular Movements: EOM normal.     Pupils: Pupils are equal, round, and reactive to light.  Neck:     Vascular: No JVD.  Cardiovascular:     Rate and Rhythm: Normal rate and regular rhythm.     Heart sounds: No murmur heard. No friction rub. No gallop.   Pulmonary:     Effort: No respiratory distress.     Breath sounds: No wheezing.  Abdominal:     General: There is no distension.  Tenderness: There is no abdominal tenderness. There is no guarding or rebound.  Musculoskeletal:        General: Normal range of motion.     Cervical back: Normal range of motion and neck supple.     Comments: Pain along the right MTP.  Some pain extending into the arch of the foot.  Pulse motor and sensation intact.  No erythema no warmth able to range the great toe without issue.  Skin:    Coloration: Skin is not pale.     Findings: No rash.  Neurological:     Mental Status: He is alert and oriented to person, place, and time.  Psychiatric:        Mood and Affect: Mood and affect normal.        Behavior: Behavior normal.     ED Results / Procedures / Treatments   Labs (all labs ordered are listed, but only abnormal results are displayed) Labs Reviewed - No data to display  EKG None  Radiology DG Toe Great Right  Result Date: 06/30/2020 CLINICAL DATA:  Right first toe pain after fall.  Initial encounter. EXAM: RIGHT GREAT TOE COMPARISON:  Right foot  films on 06/26/2020 FINDINGS: There is no evidence of fracture or dislocation. There is no evidence of arthropathy or other focal bone abnormality. Soft tissues are unremarkable. IMPRESSION: Negative. Electronically Signed   By: Irish Lack M.D.   On: 06/30/2020 09:26    Procedures Procedures (including critical care time)  Medications Ordered in ED Medications  acetaminophen (TYLENOL) tablet 1,000 mg (1,000 mg Oral Given 06/30/20 0842)  ibuprofen (ADVIL) tablet 800 mg (800 mg Oral Given 06/30/20 3818)    ED Course  I have reviewed the triage vital signs and the nursing notes.  Pertinent labs & imaging results that were available during my care of the patient were reviewed by me and considered in my medical decision making (see chart for details).    MDM Rules/Calculators/A&P                          25 yo M with a chief complaints of right great toe pain.  The patient tripped when he was going up the stairs.  No fracture is viewed by me on plain film.  Given crutches.  Tylenol ibuprofen.  PCP follow-up.  12:37 PM:  I have discussed the diagnosis/risks/treatment options with the patient and believe the pt to be eligible for discharge home to follow-up with PCP. We also discussed returning to the ED immediately if new or worsening sx occur. We discussed the sx which are most concerning (e.g., sudden worsening pain, fever, inability to tolerate by mouth) that necessitate immediate return. Medications administered to the patient during their visit and any new prescriptions provided to the patient are listed below.  Medications given during this visit Medications  acetaminophen (TYLENOL) tablet 1,000 mg (1,000 mg Oral Given 06/30/20 0842)  ibuprofen (ADVIL) tablet 800 mg (800 mg Oral Given 06/30/20 2993)     The patient appears reasonably screen and/or stabilized for discharge and I doubt any other medical condition or other [facility] requiring further screening, evaluation, or treatment in the  ED at this time prior to discharge.   Final Clinical Impression(s) / ED Diagnoses Final diagnoses:  Great toe pain, right    Rx / DC Orders ED Discharge Orders    None       Melene Plan, DO 06/30/20 1237

## 2020-06-30 NOTE — Discharge Instructions (Signed)
The 2 most likely diagnosis are either gout or planter fasciitis.  Either way they will be treated similarly.  The best medicines typically or the anti-inflammatory medicines like ibuprofen or naproxen.  Lets also have you keep your weight off of it with crutches.  Planter fasciitis tends to get much better with good supportive footwear.  Please try not to do anything barefoot or in flip-flops.  Follow-up with the family doctor in the office.

## 2021-05-29 IMAGING — CR DG TOE GREAT 2+V*R*
3 series · 3 of 3 positions shown · non-contrast
Comparison: Right foot films on 06/26/2020

CLINICAL DATA: Right first toe pain after fall.  Initial encounter.

EXAM:
RIGHT GREAT TOE

[toe ap]
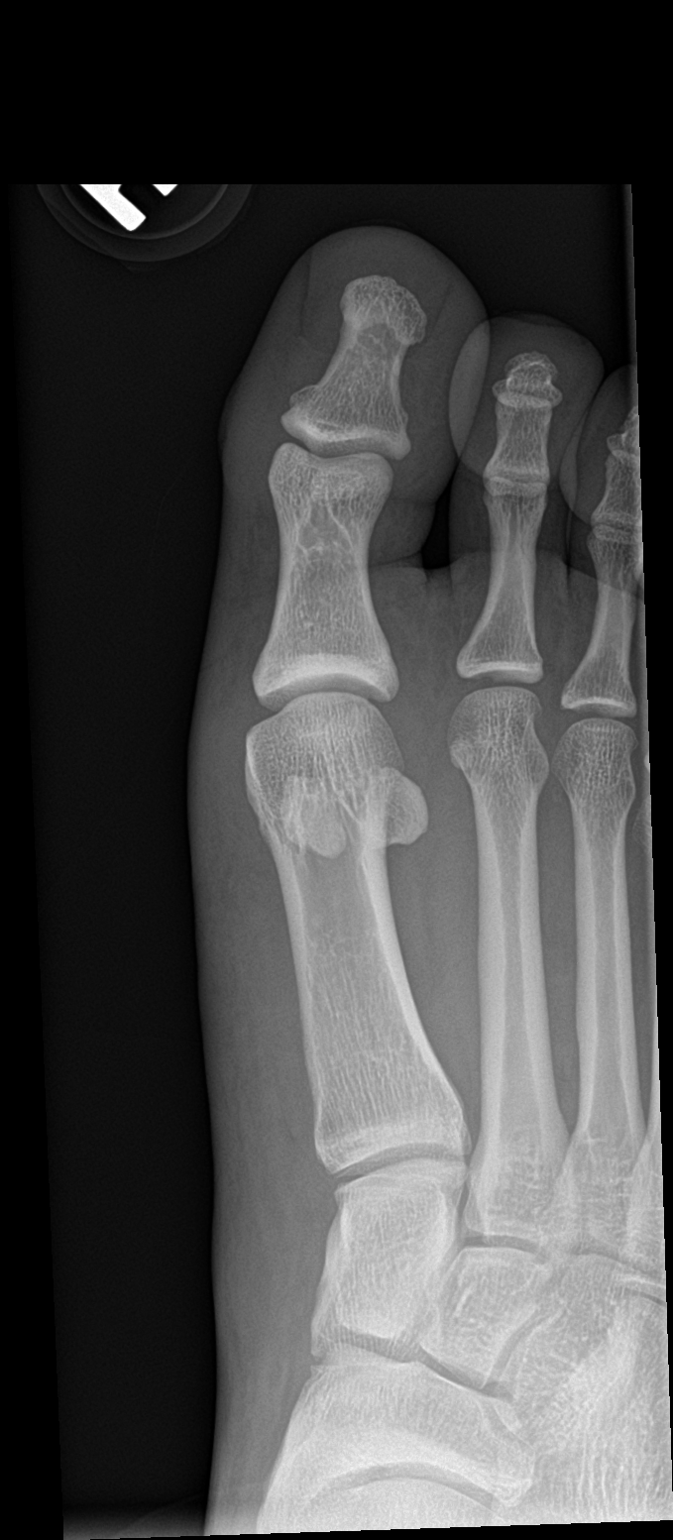

[toe obl]
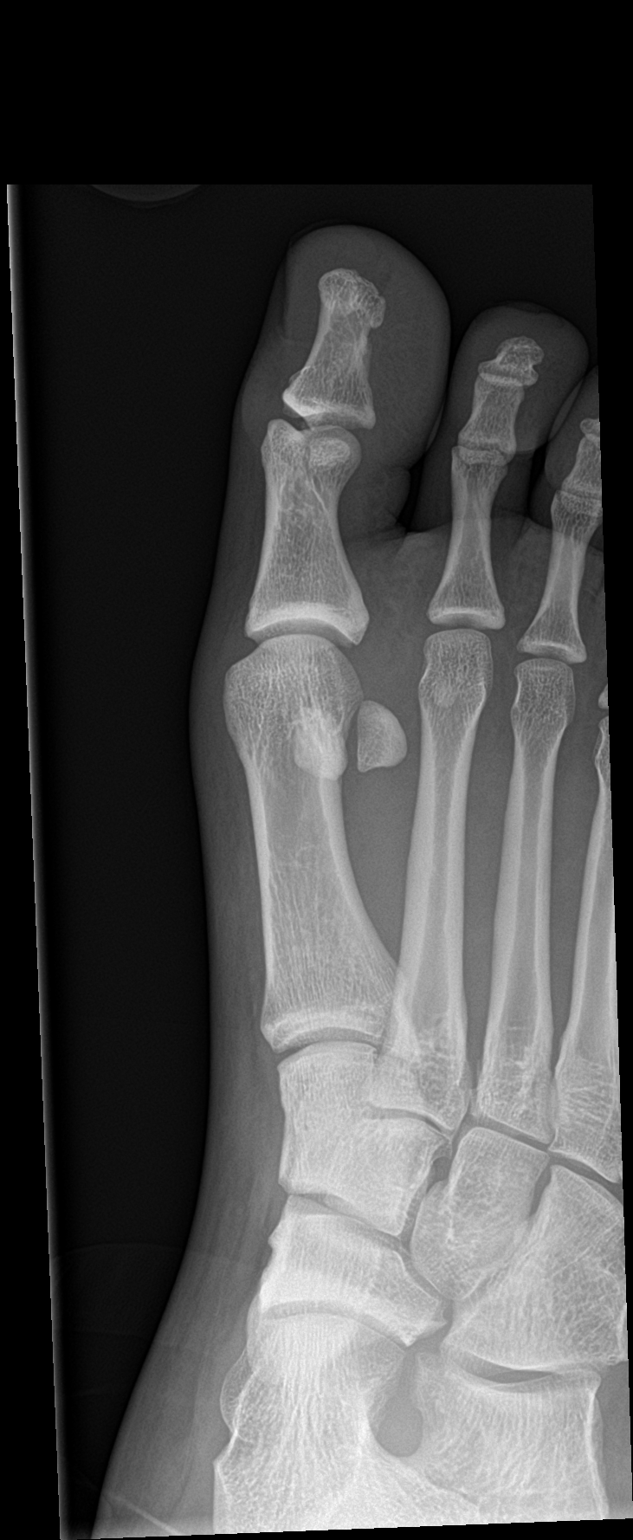

[toe lat]
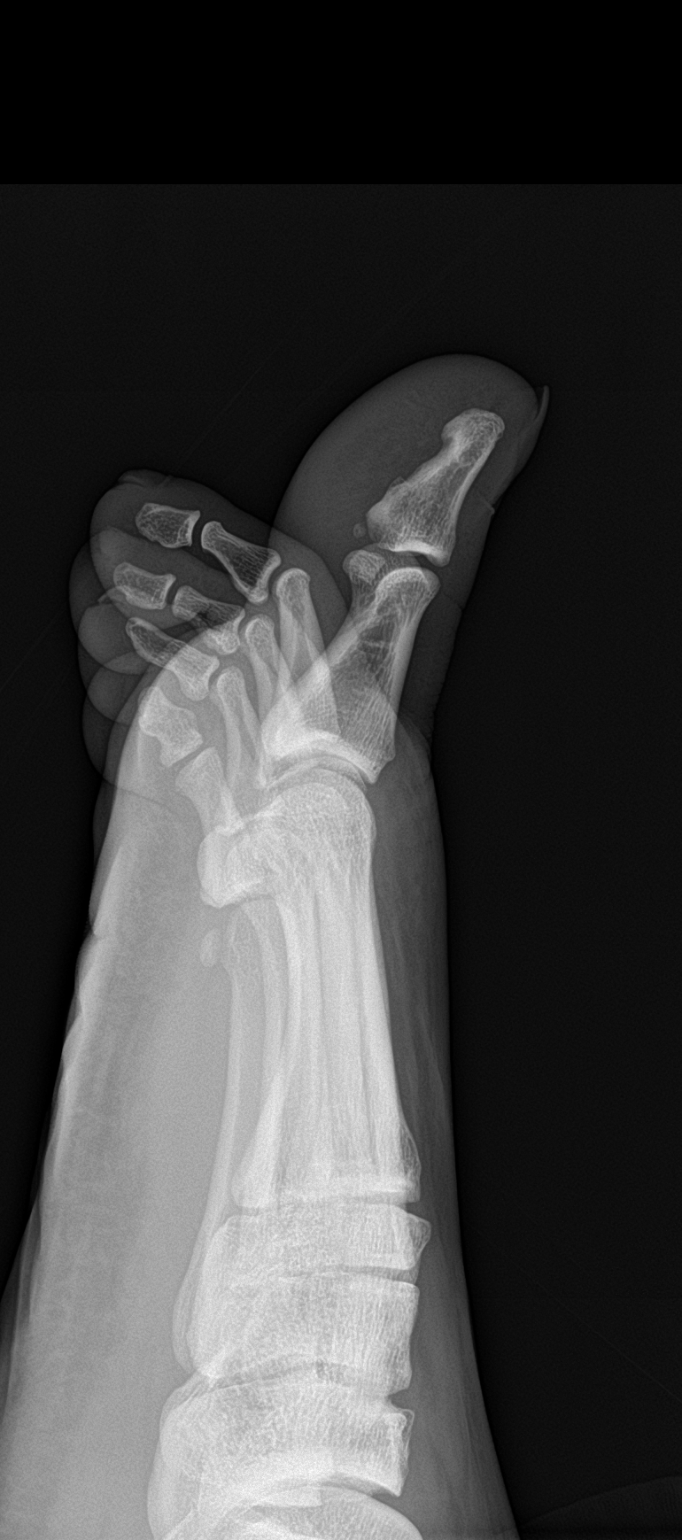

[3 of 3 positions shown; findings below may reference images not displayed]

FINDINGS: There is no evidence of fracture or dislocation. There is no
evidence of arthropathy or other focal bone abnormality. Soft
tissues are unremarkable.
IMPRESSION: Negative.

## 2022-04-25 ENCOUNTER — Other Ambulatory Visit: Payer: Self-pay

## 2022-04-25 ENCOUNTER — Encounter (HOSPITAL_COMMUNITY): Payer: Self-pay | Admitting: Emergency Medicine

## 2022-04-25 ENCOUNTER — Emergency Department (HOSPITAL_COMMUNITY)
Admission: EM | Admit: 2022-04-25 | Discharge: 2022-04-26 | Disposition: A | Discharge status: Home / Self Care | Payer: Medicaid Other | Attending: Emergency Medicine | Admitting: Emergency Medicine

## 2022-04-25 DIAGNOSIS — H5789 Other specified disorders of eye and adnexa: Secondary | ICD-10-CM

## 2022-04-25 NOTE — ED Triage Notes (Signed)
Pt c/o right eye pain after getting debris in his eye at work tonight.

## 2022-04-26 MED ORDER — TETRACAINE HCL 0.5 % OP SOLN
1.0000 [drp] | Freq: Once | OPHTHALMIC | Status: AC
  Administered 2022-04-26: 1 [drp] via OPHTHALMIC
  Filled 2022-04-26: qty 4

## 2022-04-26 MED ORDER — FLUORESCEIN SODIUM 1 MG OP STRP
1.0000 | ORAL_STRIP | Freq: Once | OPHTHALMIC | Status: AC
  Administered 2022-04-26: 1 via OPHTHALMIC
  Filled 2022-04-26: qty 1

## 2022-04-26 NOTE — ED Provider Notes (Signed)
[facility]-EMERGENCY DEPT Provider Note   CSN: [id number] Arrival date & time: 04/25/22  2253     History  Chief Complaint  Patient presents with   Eye Problem    Phillip Vasquez is a 26 y.o. male.  The history is provided by the patient.  Eye Problem Phillip Vasquez is a 26 y.o. male who presents to the Emergency Department complaining of foreign body in eye.  He presents to the emergency department for evaluation of irritation to his right eye, started while he was working he felt like he got some dust or debris in it.  This was around 7 PM.  While awaiting evaluation in the emergency department the foreign body sensation is gone.  No eye pain, vision changes, nausea, vomiting.  He does not wear corrective lenses.     Home Medications Prior to Admission medications   Medication Sig Start Date End Date Taking? Authorizing Provider  benzonatate (TESSALON) 100 MG capsule Take 1 capsule (100 mg total) by mouth 3 (three) times daily as needed for cough. 09/23/18   Michela Pitcher A, PA-C      Allergies    Patient has no known allergies.    Review of Systems   Review of Systems  All other systems reviewed and are negative.   Physical Exam Updated Vital Signs BP (!) 152/87   Pulse 72   Temp 98 F (36.7 C)   Resp 18   Ht 5\' 8"  (1.727 m)   Wt 108.9 kg   SpO2 100%   BMI 36.49 kg/m  Physical Exam Vitals and nursing note reviewed.  Constitutional:      Appearance: Normal appearance.  HENT:     Head: Normocephalic and atraumatic.     Comments: There is very mild conjunctival injection on the right.  Pupils equal round and reactive, EOMI.  No evidence of foreign body, no uptake on fluorescein staining, no corneal abrasion.    Nose: Nose normal.     Mouth/Throat:     Mouth: Mucous membranes are moist.  Cardiovascular:     Rate and Rhythm: Normal rate and regular rhythm.  Pulmonary:     Effort: Pulmonary effort is normal. No respiratory distress.   Skin:    General: Skin is warm and dry.     Capillary Refill: Capillary refill takes less than 2 seconds.  Neurological:     Mental Status: He is alert and oriented to person, place, and time.  Psychiatric:        Mood and Affect: Mood normal.     ED Results / Procedures / Treatments   Labs (all labs ordered are listed, but only abnormal results are displayed) Labs Reviewed - No data to display  EKG None  Radiology No results found.  Procedures Procedures    Medications Ordered in ED Medications  tetracaine (PONTOCAINE) 0.5 % ophthalmic solution 1 drop (has no administration in time range)  fluorescein ophthalmic strip 1 strip (has no administration in time range)    ED Course/ Medical Decision Making/ A&P                           Medical Decision Making Risk Prescription drug management.   Patient here for evaluation of foreign body sensation in his right eye.  No evidence of foreign body or abrasion on examination.  Feel he is stable for discharge home.  Symptoms have resolved at time of ED evaluation.  Discussed return precautions if he has progressive or concerning symptoms.  Discussed ophthalmology follow-up if his symptoms recur.        Final Clinical Impression(s) / ED Diagnoses Final diagnoses:  Eye irritation    Rx / DC Orders ED Discharge Orders     None         Tilden Fossa, MD 04/26/22 [phone]
